# Patient Record
Sex: Male | Born: 1975 | Hispanic: No | Marital: Married | Smoking: Never smoker
Health system: Southern US, Community
[De-identification: ages and names within clinical notes are randomized; demographics above are authoritative.]

---

## 2000-08-21 ENCOUNTER — Emergency Department (HOSPITAL_COMMUNITY): Admission: EM | Admit: 2000-08-21 | Discharge: 2000-08-22 | Payer: Self-pay | Admitting: Emergency Medicine

## 2006-05-09 ENCOUNTER — Emergency Department: Payer: Self-pay | Admitting: Emergency Medicine

## 2006-07-16 ENCOUNTER — Emergency Department (HOSPITAL_COMMUNITY): Admission: EM | Admit: 2006-07-16 | Discharge: 2006-07-16 | Payer: Self-pay | Admitting: Emergency Medicine

## 2010-11-23 LAB — I-STAT 8, (EC8 V) (CONVERTED LAB)
BUN: 13
Chloride: 105
HCT: 43
Hemoglobin: 14.6
Operator id: 215651
Potassium: 3.5
Sodium: 138
pCO2, Ven: 51.3 — ABNORMAL HIGH

## 2014-01-17 ENCOUNTER — Encounter (HOSPITAL_COMMUNITY): Payer: Self-pay | Admitting: Emergency Medicine

## 2014-01-17 ENCOUNTER — Emergency Department (HOSPITAL_COMMUNITY)
Admission: EM | Admit: 2014-01-17 | Discharge: 2014-01-17 | Disposition: A | Discharge status: Home / Self Care | Payer: BC Managed Care – PPO | Attending: Emergency Medicine | Admitting: Emergency Medicine

## 2014-01-17 DIAGNOSIS — Z79899 Other long term (current) drug therapy: Secondary | ICD-10-CM | POA: Insufficient documentation

## 2014-01-17 DIAGNOSIS — Z791 Long term (current) use of non-steroidal anti-inflammatories (NSAID): Secondary | ICD-10-CM | POA: Insufficient documentation

## 2014-01-17 DIAGNOSIS — M545 Low back pain, unspecified: Secondary | ICD-10-CM

## 2014-01-17 DIAGNOSIS — M549 Dorsalgia, unspecified: Secondary | ICD-10-CM | POA: Diagnosis present

## 2014-01-17 MED ORDER — HYDROCODONE-ACETAMINOPHEN 5-325 MG PO TABS: 1.0000 | ORAL_TABLET | ORAL | Status: DC | PRN

## 2014-01-17 MED ORDER — NAPROXEN 500 MG PO TABS: 500.0000 mg | ORAL_TABLET | Freq: Two times a day (BID) | ORAL | Status: DC

## 2014-01-17 MED ORDER — CYCLOBENZAPRINE HCL 10 MG PO TABS: 10.0000 mg | ORAL_TABLET | Freq: Two times a day (BID) | ORAL | Status: DC | PRN

## 2014-01-17 NOTE — ED Provider Notes (Signed)
CSN: [id number]     Arrival date & time 01/17/14  0950 History  This chart was scribed for non-physician practitioner, Kerrie BuffaloHope Jozelyn Kuwahara, FNP,working with Vida RollerBrian D Miller, MD, by Karle PlumberJennifer Tensley, ED Scribe. This patient was seen in room APFT23/APFT23 and the patient's care was started at 11:06 AM.  Chief Complaint  Patient presents with  . Back Pain   Patient is a 38 y.o. male presenting with back pain. The history is provided by the patient. No language interpreter was used.  Back Pain Associated symptoms: no dysuria, no fever, no headaches, no numbness and no weakness     HPI Comments:  Johnny Kelly is a 38 y.o. male who presents to the Emergency Department complaining of severe, non-radiating lower back pain that began two days ago while working lifting heavy object. 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 He states he  Walking or any other kinds of movement makes the pain worse. Denies alleviating factors. Pt has taken Tylenol and Ibuprofen with only mild relief of the pain. Denies nausea, vomiting, bowel or bladder incontinence, otalgia, sore throat, rhinorrhea, neck stiffness, fever, chills, dysuria, hematuria, frequency or difficulty urinating, visual changes, dizziness, numbness, tingling or weakness of the lower extremities, or HA. Denies trauma, injury or fall.  Does not have a PCP.  History reviewed. No pertinent past medical history. History reviewed. No pertinent past surgical history. Family History  Problem Relation Age of Onset  . Diabetes Father   . Hypertension Father    History  Substance Use Topics  . Smoking status: Never Smoker   . Smokeless tobacco: Never Used  . Alcohol Use: Yes     Comment: occasional    Review of Systems  Constitutional: Negative for fever and chills.  HENT: Negative for ear pain, rhinorrhea and sore throat.   Eyes: Negative for visual disturbance.  Gastrointestinal: Negative for nausea and vomiting.  Genitourinary:  Negative for dysuria, frequency, hematuria and decreased urine volume.       No bowel or bladder incontinence.  Musculoskeletal: Positive for back pain. Negative for neck pain and neck stiffness.  Skin: Negative for color change and wound.  Neurological: Negative for dizziness, weakness, numbness and headaches.  All other systems reviewed and are negative.   Allergies  Review of patient's allergies indicates no known allergies.  Home Medications   Prior to Admission medications   Medication Sig Start Date End Date Taking? Authorizing Provider  cyclobenzaprine (FLEXERIL) 10 MG tablet Take 1 tablet (10 mg total) by mouth 2 (two) times daily as needed for muscle spasms. 01/17/14   Joesph Marcy Orlene OchM Sophronia Varney, NP  HYDROcodone-acetaminophen (NORCO/VICODIN) 5-325 MG per tablet Take 1 tablet by mouth every 4 (four) hours as needed. 01/17/14   Lonna Rabold Orlene OchM Jaideep Pollack, NP  naproxen (NAPROSYN) 500 MG tablet Take 1 tablet (500 mg total) by mouth 2 (two) times daily. 01/17/14   Deyjah Kindel Orlene OchM Syria Kestner, NP   Triage Vitals: BP 131/67 mmHg  Pulse 70  Temp(Src) 98.8 F (37.1 C) (Oral)  Resp 16  Ht 5\' 9"  (1.753 m)  Wt 218 lb (98.884 kg)  BMI 32.18 kg/m2  SpO2 97% Physical Exam  Constitutional: He is oriented to person, place, and time. He appears well-developed and well-nourished. No distress.  HENT:  Head: Normocephalic and atraumatic.  Eyes: EOM are normal. Pupils are equal, round, and reactive to light.  Neck: Normal range of motion. Neck supple.  Cardiovascular: Normal rate, regular rhythm and normal heart sounds.  Exam reveals no  gallop and no friction rub.   No murmur heard. Adequate circulation. Radial pulses intact. Dorsalis pedis pulses equal bilaterally. No lower extremity edema.  Pulmonary/Chest: Effort normal and breath sounds normal. No respiratory distress. He has no wheezes. He has no rales.  Abdominal: Soft. Bowel sounds are normal. There is no tenderness.  Musculoskeletal: Normal range of motion. He exhibits  tenderness. He exhibits no edema.       Lumbar back: He exhibits tenderness. He exhibits normal range of motion, no deformity, no spasm and normal pulse.  Tenderness bilaterally to lumbar area. Spasm noted. Pain with any ROM. SLR without difficulty.  Neurological: He is alert and oriented to person, place, and time. He has normal strength. No cranial nerve deficit or sensory deficit. Coordination and gait normal.  Reflex Scores:      Bicep reflexes are 2+ on the right side and 2+ on the left side.      Brachioradialis reflexes are 2+ on the right side and 2+ on the left side.      Patellar reflexes are 2+ on the right side and 2+ on the left side.      Achilles reflexes are 2+ on the right side and 2+ on the left side. Ambulatory with steady gait. No foot drag. Good grip strength equal bilaterally.  Skin: Skin is warm and dry.  Psychiatric: He has a normal mood and affect. His behavior is normal.  Nursing note and vitals reviewed.   ED Course  Procedures (including critical care time) DIAGNOSTIC STUDIES: Oxygen Saturation is 97% on RA, normal by my interpretation.   COORDINATION OF CARE: 11:11 AM- Will prescribe Naproxen, Vicodin, Flexeril and give resources to follow up with PCP. Will provide work note. Pt verbalizes understanding and agrees to plan.  MDM  38 y.o. male with low back pain x 3 days. Stable for discharge without neuro deficits. Discussed with the patient clinical findings and plan of care and all questioned fully answered. He will return if any problems arise.    Medication List    TAKE these medications        cyclobenzaprine 10 MG tablet  Commonly known as:  FLEXERIL  Take 1 tablet (10 mg total) by mouth 2 (two) times daily as needed for muscle spasms.     HYDROcodone-acetaminophen 5-325 MG per tablet  Commonly known as:  NORCO/VICODIN  Take 1 tablet by mouth every 4 (four) hours as needed.     naproxen 500 MG tablet  Commonly known as:  NAPROSYN  Take 1  tablet (500 mg total) by mouth 2 (two) times daily.        Final diagnoses:  Lumbosacral pain   I personally performed the services described in this documentation, which was scribed in my presence. The recorded information has been reviewed and is accurate.    [address]Wilda Wetherell TahokaM Guneet Delpino, NP 01/18/14 1730  Vida RollerBrian D Miller, MD 01/19/14 [phone]

## 2014-01-17 NOTE — ED Notes (Signed)
Patient c/o lower back pain x3 days, progressively getting worse. 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 Denies any issue with BMs or urinary incontinence.

## 2014-08-18 ENCOUNTER — Encounter (HOSPITAL_COMMUNITY): Payer: Self-pay | Admitting: Emergency Medicine

## 2014-08-18 ENCOUNTER — Emergency Department (HOSPITAL_COMMUNITY)
Admission: EM | Admit: 2014-08-18 | Discharge: 2014-08-18 | Disposition: A | Discharge status: Home / Self Care | Payer: BLUE CROSS/BLUE SHIELD | Attending: Emergency Medicine | Admitting: Emergency Medicine

## 2014-08-18 DIAGNOSIS — K029 Dental caries, unspecified: Secondary | ICD-10-CM | POA: Insufficient documentation

## 2014-08-18 DIAGNOSIS — E663 Overweight: Secondary | ICD-10-CM | POA: Diagnosis not present

## 2014-08-18 DIAGNOSIS — K088 Other specified disorders of teeth and supporting structures: Secondary | ICD-10-CM | POA: Diagnosis not present

## 2014-08-18 DIAGNOSIS — R51 Headache: Secondary | ICD-10-CM | POA: Diagnosis not present

## 2014-08-18 DIAGNOSIS — K0889 Other specified disorders of teeth and supporting structures: Secondary | ICD-10-CM

## 2014-08-18 MED ORDER — IBUPROFEN 600 MG PO TABS: 600.0000 mg | ORAL_TABLET | Freq: Four times a day (QID) | ORAL | Status: DC | PRN

## 2014-08-18 MED ORDER — PENICILLIN V POTASSIUM 500 MG PO TABS: 500.0000 mg | ORAL_TABLET | Freq: Four times a day (QID) | ORAL | Status: AC

## 2014-08-18 MED ORDER — OXYCODONE-ACETAMINOPHEN 5-325 MG PO TABS
1.0000 | ORAL_TABLET | Freq: Once | ORAL | Status: AC
  Administered 2014-08-18: 1 via ORAL
  Filled 2014-08-18: qty 1

## 2014-08-18 MED ORDER — KETOROLAC TROMETHAMINE 60 MG/2ML IM SOLN
60.0000 mg | Freq: Once | INTRAMUSCULAR | Status: AC
  Administered 2014-08-18: 60 mg via INTRAMUSCULAR
  Filled 2014-08-18: qty 2

## 2014-08-18 NOTE — ED Notes (Signed)
Pt reports dental pain x2 days. nad noted.

## 2014-08-18 NOTE — ED Provider Notes (Signed)
CSN: [id number]     Arrival date & time 08/18/14  4098 History   First MD Initiated Contact with Patient 08/18/14 [phone]     Chief Complaint  Patient presents with  . Dental Pain     (Consider location/radiation/quality/duration/timing/severity/associated sxs/prior Treatment) HPI  This is a 39 year old male who presents with right-sided dental pain for 2 days. Patient reports onset of symptoms 2 days ago. He reports pain around his lower premolar on the right that radiates into his jaw and ear. It is causing him to have a headache. He has taken Tylenol and BC powder at home with some limited relief.  Currently he rates his pain a 10 out of 10. He denies any difficulty swallowing or fevers.  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  History reviewed. No pertinent past medical history. History reviewed. No pertinent past surgical history. History reviewed. No pertinent family history. History  Substance Use Topics  . Smoking status: Never Smoker   . Smokeless tobacco: Not on file  . Alcohol Use: No    Review of Systems  Constitutional: Negative for fever.  HENT: Positive for dental problem. Negative for trouble swallowing.   Musculoskeletal: Negative for neck pain.  Neurological: Positive for headaches.  All other systems reviewed and are negative.     Allergies  Review of patient's allergies indicates not on file.  Home Medications   Prior to Admission medications   Medication Sig Start Date End Date Taking? Authorizing Provider  ibuprofen (ADVIL,MOTRIN) 600 MG tablet Take 1 tablet (600 mg total) by mouth every 6 (six) hours as needed. 08/18/14   Shon Baton, MD  penicillin v potassium (VEETID) 500 MG tablet Take 1 tablet (500 mg total) by mouth 4 (four) times daily. 08/18/14 08/25/14  Shon Baton, MD   BP 148/102 mmHg  Pulse 55  Temp(Src) 97.6 F (36.4 C) (Oral)  Resp 18  Ht  (1.702 m)  Wt 214  lb (97.07 kg)  BMI 33.51 kg/m2  SpO2 98% Physical Exam  Constitutional: He is oriented to person, place, and time. He appears well-developed and well-nourished.  Overweight  HENT:  Head: Normocephalic and atraumatic.  Poor dentition, multiple teeth pulled, tenderness over the gumline and periodontal tissue of the right premolar adjacent to prior pulled teeth, several dental caries noted, patient able to elevate tongue, no fullness in the sublingual region, no facial swelling or cellulitis noted, no abscess noted  Cardiovascular: Normal rate and regular rhythm.   Pulmonary/Chest: Effort normal. No respiratory distress.  Musculoskeletal: He exhibits no edema.  Neurological: He is alert and oriented to person, place, and time.  Skin: Skin is warm and dry.  Psychiatric: He has a normal mood and affect.  Nursing note and vitals reviewed.   ED Course  Procedures (including critical care time) Labs Review Labs Reviewed - No data to display  Imaging Review No results found.   EKG Interpretation None      MDM   Final diagnoses:  Pain, dental    Patient presents with uncomplicated dental pain. No signs of obvious abscess or dental infection. Patient given antibiotics and instructed to take 600 mg ibuprofen every 6 hours. He was given a dose of Toradol here. Patient follow-up with his dentist as scheduled.  After history, exam, and medical workup I feel the patient has been appropriately medically screened and is safe for discharge home. Pertinent diagnoses  were discussed with the patient. Patient was given return precautions.     Shon Batonourtney F Logan [place], MD 08/18/14 [phone]

## 2014-08-18 NOTE — Discharge Instructions (Signed)
You were seen today for dental pain. There is no obvious abscess. No other signs or symptoms of infection. He'll be given antibiotics and need to follow-up closely with your dentist. If he developed facial swelling, difficulty swallowing or any new or worsening symptoms you need to be reevaluated.  Dental Pain A tooth ache may be caused by cavities (tooth decay). Cavities expose the nerve of the tooth to air and hot or cold temperatures. It may come from an infection or abscess (also called a boil or furuncle) around your tooth. It is also often caused by dental caries (tooth decay). This causes the pain you are having. DIAGNOSIS  Your caregiver can diagnose this problem by exam. TREATMENT   If caused by an infection, it may be treated with medications which kill germs (antibiotics) and pain medications as prescribed by your caregiver. Take medications as directed.  Only take over-the-counter or prescription medicines for pain, discomfort, or fever as directed by your caregiver.  Whether the tooth ache today is caused by infection or dental disease, you should see your dentist as soon as possible for further care. SEEK MEDICAL CARE IF: The exam and treatment you received today has been provided on an emergency basis only. This is not a substitute for complete medical or dental care. If your problem worsens or new problems (symptoms) appear, and you are unable to meet with your dentist, call or return to this location. SEEK IMMEDIATE MEDICAL CARE IF:   You have a fever.  You develop redness and swelling of your face, jaw, or neck.  You are unable to open your mouth.  You have severe pain uncontrolled by pain medicine. MAKE SURE YOU:   Understand these instructions.  Will watch your condition.  Will get help right away if you are not doing well or get worse. Document Released: 01/22/2005 Document Revised: 04/16/2011 Document Reviewed: 09/10/2007 [facility] Patient Information 2015  [employer] [place] [place]. This information is not intended to replace advice given to you by your health care provider. Make sure you discuss any questions you have with your health care provider.

## 2015-05-23 ENCOUNTER — Encounter (HOSPITAL_COMMUNITY): Payer: Self-pay | Admitting: Emergency Medicine

## 2015-05-23 ENCOUNTER — Emergency Department (HOSPITAL_COMMUNITY)
Admission: EM | Admit: 2015-05-23 | Discharge: 2015-05-23 | Disposition: A | Discharge status: Home / Self Care | Payer: BLUE CROSS/BLUE SHIELD | Attending: Emergency Medicine | Admitting: Emergency Medicine

## 2015-05-23 DIAGNOSIS — H6091 Unspecified otitis externa, right ear: Secondary | ICD-10-CM | POA: Diagnosis not present

## 2015-05-23 DIAGNOSIS — Z77098 Contact with and (suspected) exposure to other hazardous, chiefly nonmedicinal, chemicals: Secondary | ICD-10-CM | POA: Insufficient documentation

## 2015-05-23 DIAGNOSIS — H9391 Unspecified disorder of right ear: Secondary | ICD-10-CM | POA: Diagnosis present

## 2015-05-23 MED ORDER — NEOMYCIN-POLYMYXIN-HC 1 % OT SOLN
3.0000 [drp] | Freq: Once | OTIC | Status: AC
  Administered 2015-05-23: 3 [drp] via OTIC
  Filled 2015-05-23: qty 10

## 2015-05-23 NOTE — ED Provider Notes (Signed)
CSN: [id number]     Arrival date & time 05/23/15  1757 History  By signing my name below, I, Johnny Kelly, attest that this documentation has been prepared under the direction and in the presence of Burgess AmorJulie Madolyn Ackroyd, PA-C Electronically Signed: Soijett Kelly, ED Scribe. 05/23/2015. 7:24 PM.    Chief Complaint  Patient presents with  . Ear Fullness    right      The history is provided by the patient. No language interpreter was used.    HPI Comments: Johnny Kelly is a 40 y.o. male who presents to the Emergency Department complaining of right ear fullness onset 5 days. Pt reports that he got gas in his right ear while completing work on his pickup truck. Pt states that he initially had ear pain which has since resolved. Pt notes that he has a buzzing sound to his right ear. Pt reports that he cleaned his right ear with Q-tips and there was pus on the Q-tip. Pt has associated symptoms of muffled hearing to right ear. He states that he has tried warm water flush, heating pad, and peroxide, with no relief for his symptoms. He denies ear pain/discharge, fever, and any other symptoms. Denies having a PCP. Denies allergies to medications. 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    History reviewed. No pertinent past medical history. History reviewed. No pertinent past surgical history. Family History  Problem Relation Age of Onset  . Diabetes Father   . Hypertension Father    Social History  Substance Use Topics  . Smoking status: Never Smoker   . Smokeless tobacco: None  . Alcohol Use: No     Comment: occasional    Review of Systems  Constitutional: Negative for fever.  HENT: Negative for ear discharge and ear pain.        Right ear fullness with muffled hearing.      Allergies  Review of patient's allergies indicates no known allergies.  Home Medications   Prior to Admission medications   Medication Sig Start Date End Date Taking? Authorizing Provider   cyclobenzaprine (FLEXERIL) 10 MG tablet Take 1 tablet (10 mg total) by mouth 2 (two) times daily as needed for muscle spasms. 01/17/14   Hope Orlene OchM Neese, NP  HYDROcodone-acetaminophen (NORCO/VICODIN) 5-325 MG per tablet Take 1 tablet by mouth every 4 (four) hours as needed. 01/17/14   Hope Orlene OchM Neese, NP  ibuprofen (ADVIL,MOTRIN) 600 MG tablet Take 1 tablet (600 mg total) by mouth every 6 (six) hours as needed. 08/18/14   Shon Batonourtney F Horton, MD  naproxen (NAPROSYN) 500 MG tablet Take 1 tablet (500 mg total) by mouth 2 (two) times daily. 01/17/14   Hope Orlene OchM Neese, NP   BP 118/75 mmHg  Pulse 64  Temp(Src) 98.5 F (36.9 C) (Oral)  Resp 16  Ht 5\' 7"  (1.702 m)  Wt 95.255 kg  BMI 32.88 kg/m2  SpO2 98% Physical Exam  Constitutional: He is oriented to person, place, and time. He appears well-developed and well-nourished. No distress.  HENT:  Head: Normocephalic and atraumatic.  Right Ear: Tympanic membrane is erythematous. Tympanic membrane is not bulging.  Right TM is erythematous but appears intact without bulging. Small collection of white fluid at the base of the TM at the external canal. Canal otherwise looks healthy without injury. Hearing acuity is present but reduced in comparison to the left.   Eyes: EOM are normal.  Neck: Neck supple.  Cardiovascular: Normal rate.   Pulmonary/Chest: Effort  normal. No respiratory distress.  Musculoskeletal: Normal range of motion.  Lymphadenopathy:       Head (right side): No submental, no submandibular, no tonsillar, no preauricular and no posterior auricular adenopathy present.       Head (left side): No submental, no submandibular, no tonsillar, no preauricular and no posterior auricular adenopathy present.    He has no cervical adenopathy.  Neurological: He is alert and oriented to person, place, and time.  Skin: Skin is warm and dry.  Psychiatric: He has a normal mood and affect. His behavior is normal.  Nursing note and vitals reviewed.   ED Course   Procedures (including critical care time) DIAGNOSTIC STUDIES: Oxygen Saturation is 98% on RA, nl by my interpretation.    COORDINATION OF CARE: 7:24 PM Discussed treatment plan with pt at bedside and pt agreed to plan.    Labs Review Labs Reviewed - No data to display  Imaging Review No results found.   EKG Interpretation None      MDM   Final diagnoses:  External otitis of right ear  Exposure to chemical irritant    Advised to stop using peroxide which may be contributing to irritation. Pt was placed on cortisporin gtts. Advised he needs a recheck by ENT this week. Referred to Dr. Suszanne Conners for a recheck in his [place] office this week.  Pt understands and agrees with plan. No caustic injury noted to ear canal, TM appears inflamed but intact.  I personally performed the services described in this documentation, which was scribed in my presence. The recorded information has been reviewed and is accurate.   Burgess Amor, PA-C 05/24/15 1321  Glynn Octave, MD 05/24/15 [phone]

## 2015-05-23 NOTE — Discharge Instructions (Signed)
Apply 3 drops of the cortisporin to your right ear three times daily.  Stop applying peroxide to the ear as this can be irritating.

## 2015-05-23 NOTE — ED Notes (Signed)
Pt got gas in right ear on last Thursday.  [facility] and told to wash ear out with warm water and use to use a heating pad.  Pt says he hears fluid in ear.  Denies any pain.

## 2018-02-24 ENCOUNTER — Other Ambulatory Visit: Payer: Self-pay

## 2018-02-24 ENCOUNTER — Emergency Department (HOSPITAL_COMMUNITY)
Admission: EM | Admit: 2018-02-24 | Discharge: 2018-02-24 | Disposition: A | Discharge status: Home / Self Care | Payer: Self-pay | Attending: Emergency Medicine | Admitting: Emergency Medicine

## 2018-02-24 ENCOUNTER — Emergency Department (HOSPITAL_COMMUNITY): Payer: Self-pay

## 2018-02-24 ENCOUNTER — Encounter (HOSPITAL_COMMUNITY): Payer: Self-pay

## 2018-02-24 DIAGNOSIS — Z79899 Other long term (current) drug therapy: Secondary | ICD-10-CM | POA: Insufficient documentation

## 2018-02-24 DIAGNOSIS — J40 Bronchitis, not specified as acute or chronic: Secondary | ICD-10-CM | POA: Insufficient documentation

## 2018-02-24 MED ORDER — PREDNISONE 50 MG PO TABS
60.0000 mg | ORAL_TABLET | Freq: Once | ORAL | Status: AC
  Administered 2018-02-24: 60 mg via ORAL
  Filled 2018-02-24: qty 1

## 2018-02-24 MED ORDER — PREDNISONE 10 MG PO TABS: 40.0000 mg | ORAL_TABLET | Freq: Every day | ORAL | 0 refills | Status: DC

## 2018-02-24 MED ORDER — IPRATROPIUM-ALBUTEROL 0.5-2.5 (3) MG/3ML IN SOLN
3.0000 mL | Freq: Once | RESPIRATORY_TRACT | Status: AC
  Administered 2018-02-24: 3 mL via RESPIRATORY_TRACT
  Filled 2018-02-24: qty 3

## 2018-02-24 MED ORDER — ALBUTEROL SULFATE HFA 108 (90 BASE) MCG/ACT IN AERS
2.0000 | INHALATION_SPRAY | Freq: Four times a day (QID) | RESPIRATORY_TRACT | Status: DC
  Administered 2018-02-24: 2 via RESPIRATORY_TRACT
  Filled 2018-02-24: qty 6.7

## 2018-02-24 NOTE — Discharge Instructions (Signed)
Take the prednisone as directed for the next 5 days.  Use your albuterol inhaler 2 puffs every 6 hours for the next 7 days.  Then as needed.  Return for any new or worse symptoms.  Chest x-ray today without evidence of pneumonia.

## 2018-02-24 NOTE — ED Triage Notes (Signed)
Pt reports cough x 1 week. Cough is non productive. Chest feels tight. Pt reports is has used mucinex

## 2018-02-24 NOTE — ED Notes (Signed)
Pt still has bilateral expiratory and inspiratory wheezes, but they are significantly better.

## 2018-02-24 NOTE — ED Provider Notes (Signed)
[facility] EMERGENCY DEPARTMENT Provider Note   CSN: [id number] Arrival date & time: 02/24/18  0706     History   Chief Complaint Chief Complaint  Patient presents with  . Cough    HPI Johnny Kelly is a 43 y.o. male.  Patient with productive cough for 1 week.  Actually may be a little bit longer according to his wife.  Associated with some chest tightness.  Some diaphoresis.  No true fevers no nausea vomiting or diarrhea.     History reviewed. No pertinent past medical history.  There are no active problems to display for this patient.   History reviewed. No pertinent surgical history.      Home Medications    Prior to Admission medications   Medication Sig Start Date End Date Taking? Authorizing Provider  cyclobenzaprine (FLEXERIL) 10 MG tablet Take 1 tablet (10 mg total) by mouth 2 (two) times daily as needed for muscle spasms. 01/17/14   Janne Napoleon, NP  HYDROcodone-acetaminophen (NORCO/VICODIN) 5-325 MG per tablet Take 1 tablet by mouth every 4 (four) hours as needed. 01/17/14   Janne Napoleon, NP  ibuprofen (ADVIL,MOTRIN) 600 MG tablet Take 1 tablet (600 mg total) by mouth every 6 (six) hours as needed. 08/18/14   Horton, Mayer Masker, MD  naproxen (NAPROSYN) 500 MG tablet Take 1 tablet (500 mg total) by mouth 2 (two) times daily. 01/17/14   Janne Napoleon, NP  predniSONE (DELTASONE) 10 MG tablet Take 4 tablets (40 mg total) by mouth daily. 02/24/18   Vanetta Mulders, MD    Family History Family History  Problem Relation Age of Onset  . Diabetes Father   . Hypertension Father     Social History Social History   Tobacco Use  . Smoking status: Never Smoker  Substance Use Topics  . Alcohol use: No    Comment: occasional  . Drug use: No     Allergies   Patient has no known allergies.   Review of Systems Review of Systems  Constitutional: Positive for diaphoresis. Negative for chills and fever.  HENT: Positive for congestion. Negative for  rhinorrhea and sore throat.   Eyes: Negative for visual disturbance.  Respiratory: Positive for cough. Negative for shortness of breath.   Cardiovascular: Negative for chest pain and leg swelling.  Gastrointestinal: Negative for abdominal pain, diarrhea, nausea and vomiting.  Genitourinary: Negative for dysuria.  Musculoskeletal: Negative for back pain and neck pain.  Skin: Negative for rash.  Neurological: Negative for dizziness, light-headedness and headaches.  Hematological: Does not bruise/bleed easily.  Psychiatric/Behavioral: Negative for confusion.     Physical Exam Updated Vital Signs BP (!) 134/91 (BP Location: Right Arm)   Pulse 70   Temp 98.5 F (36.9 C) (Oral)   Resp 20   Ht 1.702 m (5\' 7" )   Wt 104.3 kg   SpO2 96%   BMI 36.02 kg/m   Physical Exam Vitals signs and nursing note reviewed.  Constitutional:      Appearance: He is well-developed.  HENT:     Head: Normocephalic and atraumatic.  Eyes:     Conjunctiva/sclera: Conjunctivae normal.  Neck:     Musculoskeletal: Neck supple.  Cardiovascular:     Rate and Rhythm: Normal rate and regular rhythm.     Heart sounds: No murmur.  Pulmonary:     Effort: Pulmonary effort is normal. No respiratory distress.     Breath sounds: Wheezing present.  Abdominal:     Palpations: Abdomen is soft.  Tenderness: There is no abdominal tenderness.  Musculoskeletal: Normal range of motion.  Skin:    General: Skin is warm and dry.  Neurological:     General: No focal deficit present.     Mental Status: He is alert and oriented to person, place, and time.      ED Treatments / Results  Labs (all labs ordered are listed, but only abnormal results are displayed) Labs Reviewed - No data to display  EKG None  Radiology Dg Chest 2 View  Result Date: 02/24/2018 CLINICAL DATA:  Cough for approximately 1 week EXAM: CHEST - 2 VIEW COMPARISON:  None. FINDINGS: Lungs are clear. Heart size and pulmonary vascularity are  normal. No adenopathy. No evident bone lesions. IMPRESSION: No edema or consolidation. Electronically Signed   By: Bretta Bang III M.D.   On: 02/24/2018 07:48    Procedures Procedures (including critical care time)  Medications Ordered in ED Medications  predniSONE (DELTASONE) tablet 60 mg (has no administration in time range)  albuterol (PROVENTIL HFA;VENTOLIN HFA) 108 (90 Base) MCG/ACT inhaler 2 puff (has no administration in time range)  ipratropium-albuterol (DUONEB) 0.5-2.5 (3) MG/3ML nebulizer solution 3 mL (3 mLs Nebulization Given 02/24/18 0813)     Initial Impression / Assessment and Plan / ED Course  I have reviewed the triage vital signs and the nursing notes.  Pertinent labs & imaging results that were available during my care of the patient were reviewed by me and considered in my medical decision making (see chart for details).     Patient nontoxic no acute distress.  Chest x-ray negative for pneumonia.  Patient did have some bilateral wheezing.  Did improve with albuterol Atrovent nebulizer.  But still had some slight wheezing.  Patient felt much better however.  We will go ahead and treat with albuterol inhaler and prednisone.  Symptoms consistent with a bronchitis probably secondary to an upper respiratory infection.  Even possibly the flu.  Final Clinical Impressions(s) / ED Diagnoses   Final diagnoses:  Bronchitis    ED Discharge Orders         Ordered    predniSONE (DELTASONE) 10 MG tablet  Daily     02/24/18 0831           Vanetta Mulders, MD 02/24/18 [phone]

## 2018-02-24 NOTE — ED Notes (Signed)
Have paged respiratory  

## 2018-03-24 ENCOUNTER — Encounter (HOSPITAL_COMMUNITY): Payer: Self-pay | Admitting: Emergency Medicine

## 2018-03-24 ENCOUNTER — Emergency Department (HOSPITAL_COMMUNITY): Payer: Self-pay

## 2018-03-24 ENCOUNTER — Other Ambulatory Visit: Payer: Self-pay

## 2018-03-24 ENCOUNTER — Emergency Department (HOSPITAL_COMMUNITY)
Admission: EM | Admit: 2018-03-24 | Discharge: 2018-03-25 | Disposition: A | Discharge status: Home / Self Care | Payer: Self-pay | Attending: Emergency Medicine | Admitting: Emergency Medicine

## 2018-03-24 DIAGNOSIS — J209 Acute bronchitis, unspecified: Secondary | ICD-10-CM | POA: Insufficient documentation

## 2018-03-24 DIAGNOSIS — Z79899 Other long term (current) drug therapy: Secondary | ICD-10-CM | POA: Insufficient documentation

## 2018-03-24 DIAGNOSIS — J4541 Moderate persistent asthma with (acute) exacerbation: Secondary | ICD-10-CM | POA: Insufficient documentation

## 2018-03-24 MED ORDER — ALBUTEROL SULFATE (2.5 MG/3ML) 0.083% IN NEBU
5.0000 mg | INHALATION_SOLUTION | Freq: Once | RESPIRATORY_TRACT | Status: AC
  Administered 2018-03-25: 5 mg via RESPIRATORY_TRACT
  Filled 2018-03-24: qty 6

## 2018-03-24 NOTE — ED Triage Notes (Signed)
Pt c/o SOB intermittently x 6 weeks, pt c/o cough and congestion and reports being seen for same, pt was given prednisone and an inhaler which has not helped

## 2018-03-25 LAB — BASIC METABOLIC PANEL
ANION GAP: 7 (ref 5–15)
BUN: 17 mg/dL (ref 6–20)
CALCIUM: 9.2 mg/dL (ref 8.9–10.3)
CO2: 24 mmol/L (ref 22–32)
Chloride: 107 mmol/L (ref 98–111)
Creatinine, Ser: 0.8 mg/dL (ref 0.61–1.24)
GFR calc Af Amer: >60 mL/min (ref >60)
GFR calc non Af Amer: >60 mL/min (ref >60)
GLUCOSE: 111 mg/dL — AB (ref 70–99)
POTASSIUM: 3.5 mmol/L (ref 3.5–5.1)
Sodium: 138 mmol/L (ref 135–145)

## 2018-03-25 LAB — CBC WITH DIFFERENTIAL/PLATELET
Abs Immature Granulocytes: 0.05 10*3/uL (ref 0.00–0.07)
Basophils Absolute: 0.1 10*3/uL (ref 0.0–0.1)
Basophils Relative: 1 %
EOS ABS: 1 10*3/uL — AB (ref 0.0–0.5)
Eosinophils Relative: 11 %
HCT: 48.8 % (ref 39.0–52.0)
Hemoglobin: 17.1 g/dL — ABNORMAL HIGH (ref 13.0–17.0)
IMMATURE GRANULOCYTES: 1 %
Lymphocytes Relative: 43 %
Lymphs Abs: 3.9 10*3/uL (ref 0.7–4.0)
MCH: 30.9 pg (ref 26.0–34.0)
MCHC: 35 g/dL (ref 30.0–36.0)
MCV: 88.1 fL (ref 80.0–100.0)
MONOS PCT: 6 %
Monocytes Absolute: 0.5 10*3/uL (ref 0.1–1.0)
NEUTROS PCT: 38 %
Neutro Abs: 3.4 10*3/uL (ref 1.7–7.7)
Platelets: 241 10*3/uL (ref 150–400)
RBC: 5.54 MIL/uL (ref 4.22–5.81)
RDW: 12.7 % (ref 11.5–15.5)
WBC: 9 10*3/uL (ref 4.0–10.5)
nRBC: 0 % (ref 0.0–0.2)

## 2018-03-25 LAB — TROPONIN I: Troponin I: <0.03 ng/mL (ref <0.03)

## 2018-03-25 MED ORDER — ALBUTEROL SULFATE HFA 108 (90 BASE) MCG/ACT IN AERS
2.0000 | INHALATION_SPRAY | RESPIRATORY_TRACT | Status: DC | PRN
  Administered 2018-03-25: 2 via RESPIRATORY_TRACT
  Filled 2018-03-25: qty 6.7

## 2018-03-25 MED ORDER — PREDNISONE 20 MG PO TABS: 60.0000 mg | ORAL_TABLET | Freq: Every day | ORAL | 0 refills | Status: AC

## 2018-03-25 MED ORDER — ALBUTEROL SULFATE (2.5 MG/3ML) 0.083% IN NEBU: 5.0000 mg | INHALATION_SOLUTION | Freq: Once | RESPIRATORY_TRACT | Status: DC

## 2018-03-25 MED ORDER — METHYLPREDNISOLONE SODIUM SUCC 125 MG IJ SOLR
125.0000 mg | Freq: Once | INTRAMUSCULAR | Status: AC
  Administered 2018-03-25: 125 mg via INTRAVENOUS
  Filled 2018-03-25: qty 2

## 2018-03-25 MED ORDER — IPRATROPIUM-ALBUTEROL 0.5-2.5 (3) MG/3ML IN SOLN
3.0000 mL | Freq: Once | RESPIRATORY_TRACT | Status: AC
  Administered 2018-03-25: 3 mL via RESPIRATORY_TRACT
  Filled 2018-03-25: qty 3

## 2018-03-25 NOTE — ED Provider Notes (Signed)
[facility] EMERGENCY DEPARTMENT Provider Note   CSN: [id number] Arrival date & time: 03/24/18  2228    History   Chief Complaint Chief Complaint  Patient presents with  . Shortness of Breath    HPI Johnny Kelly is a 43 y.o. male.     The history is provided by the patient. No language interpreter was used.  Shortness of Breath  Severity:  Moderate Onset quality:  Gradual Duration:  1 month Timing:  Constant Progression:  Worsening Chronicity:  Recurrent Relieved by:  Nothing Worsened by:  Nothing Ineffective treatments:  Inhaler Associated symptoms: chest pain   Risk factors comment:  Second hand smoke   History reviewed. No pertinent past medical history.  There are no active problems to display for this patient.   History reviewed. No pertinent surgical history.      Home Medications    Prior to Admission medications   Medication Sig Start Date End Date Taking? Authorizing Provider  cyclobenzaprine (FLEXERIL) 10 MG tablet Take 1 tablet (10 mg total) by mouth 2 (two) times daily as needed for muscle spasms. 01/17/14   Janne Napoleon, NP  HYDROcodone-acetaminophen (NORCO/VICODIN) 5-325 MG per tablet Take 1 tablet by mouth every 4 (four) hours as needed. 01/17/14   Janne Napoleon, NP  ibuprofen (ADVIL,MOTRIN) 600 MG tablet Take 1 tablet (600 mg total) by mouth every 6 (six) hours as needed. 08/18/14   Horton, Mayer Masker, MD  naproxen (NAPROSYN) 500 MG tablet Take 1 tablet (500 mg total) by mouth 2 (two) times daily. 01/17/14   Janne Napoleon, NP  predniSONE (DELTASONE) 10 MG tablet Take 4 tablets (40 mg total) by mouth daily. 02/24/18   Vanetta Mulders, MD    Family History Family History  Problem Relation Age of Onset  . Diabetes Father   . Hypertension Father     Social History Social History   Tobacco Use  . Smoking status: Never Smoker  . Smokeless tobacco: Never Used  Substance Use Topics  . Alcohol use: No    Comment: occasional  . Drug  use: No     Allergies   Patient has no known allergies.   Review of Systems Review of Systems  Respiratory: Positive for shortness of breath.   Cardiovascular: Positive for chest pain.  All other systems reviewed and are negative.    Physical Exam Updated Vital Signs BP 116/85 (BP Location: Right Arm)   Pulse 72   Temp 98.2 F (36.8 C) (Oral)   Resp 20   Ht 5\' 7"  (1.702 m)   Wt 96.6 kg   SpO2 98%   BMI 33.36 kg/m   Physical Exam Vitals signs and nursing note reviewed.  Constitutional:      Appearance: He is well-developed.  HENT:     Head: Normocephalic.     Mouth/Throat:     Mouth: Mucous membranes are moist.  Neck:     Musculoskeletal: Normal range of motion.  Cardiovascular:     Rate and Rhythm: Normal rate.  Pulmonary:     Effort: Pulmonary effort is normal.     Breath sounds: Examination of the right-upper field reveals wheezing. Examination of the left-upper field reveals wheezing. Examination of the right-middle field reveals wheezing. Examination of the left-middle field reveals wheezing. Examination of the right-lower field reveals wheezing. Examination of the left-lower field reveals wheezing. Wheezing present.  Chest:     Chest wall: No mass or deformity.  Abdominal:     General: There  is no distension.  Musculoskeletal: Normal range of motion.  Skin:    General: Skin is warm.  Neurological:     General: No focal deficit present.     Mental Status: He is alert and oriented to person, place, and time.  Psychiatric:        Mood and Affect: Mood normal.      ED Treatments / Results  Labs (all labs ordered are listed, but only abnormal results are displayed) Labs Reviewed  CBC WITH DIFFERENTIAL/PLATELET  BASIC METABOLIC PANEL    EKG EKG Interpretation  Date/Time:  Monday March 24 2018 23:04:38 EST Ventricular Rate:  71 PR Interval:  178 QRS Duration: 90 QT Interval:  382 QTC Calculation: 415 R Axis:   57 Text Interpretation:   Normal sinus rhythm Normal ECG No old tracing to compare Confirmed by Dione Booze ([id number]) on 03/24/2018 11:11:42 PM   Radiology Dg Chest 2 View  Result Date: 03/24/2018 CLINICAL DATA:  Chest congestion EXAM: CHEST - 2 VIEW COMPARISON:  02/24/2018 FINDINGS: The heart size and mediastinal contours are within normal limits. Both lungs are clear. The visualized skeletal structures are unremarkable. IMPRESSION: No active cardiopulmonary disease. Electronically Signed   By: Jasmine Pang M.D.   On: 03/24/2018 23:53    Procedures Procedures (including critical care time)  Medications Ordered in ED Medications  methylPREDNISolone sodium succinate (SOLU-MEDROL) 125 mg/2 mL injection 125 mg (has no administration in time range)  ipratropium-albuterol (DUONEB) 0.5-2.5 (3) MG/3ML nebulizer solution 3 mL (has no administration in time range)  albuterol (PROVENTIL) (2.5 MG/3ML) 0.083% nebulizer solution 5 mg (5 mg Nebulization Given 03/25/18 0016)     Initial Impression / Assessment and Plan / ED Course  I have reviewed the triage vital signs and the nursing notes.  Pertinent labs & imaging results that were available during my care of the patient were reviewed by me and considered in my medical decision making (see chart for details).        Pt had albuterol neb x 1.  Reexam  Continued wheezing.  Chart reviewed from previous visit.    Final Clinical Impressions(s) / ED Diagnoses   Final diagnoses:  Moderate persistent asthma with exacerbation    ED Discharge Orders    None       Osie Cheeks 03/25/18 0059    Dione Booze, MD 03/25/18 [phone]

## 2020-07-26 IMAGING — DX DG CHEST 2V
2 series · 2 of 2 positions shown · non-contrast
Comparison: None.

CLINICAL DATA: Cough for approximately 1 week

EXAM:
CHEST - 2 VIEW

[chest pa]
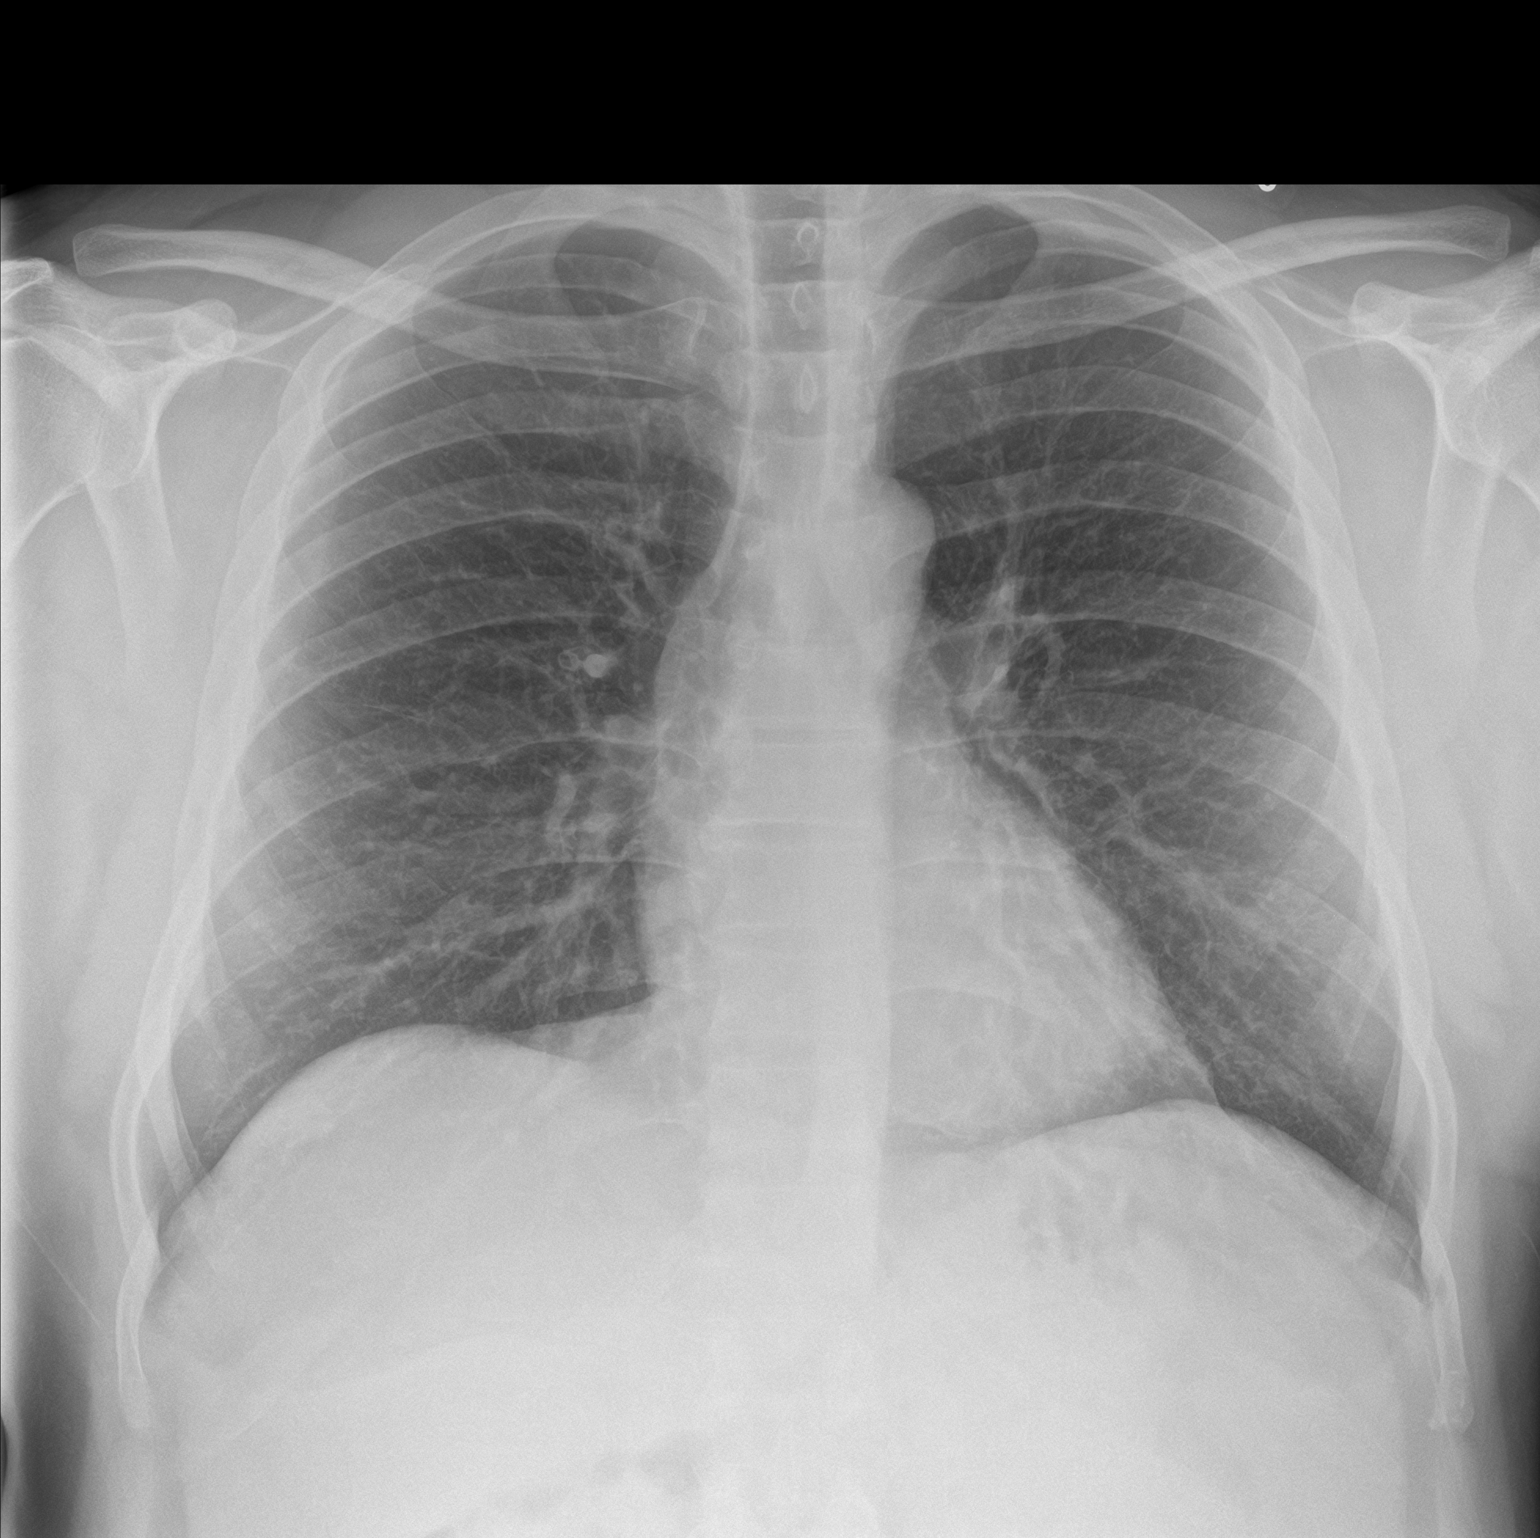

[chest lat]
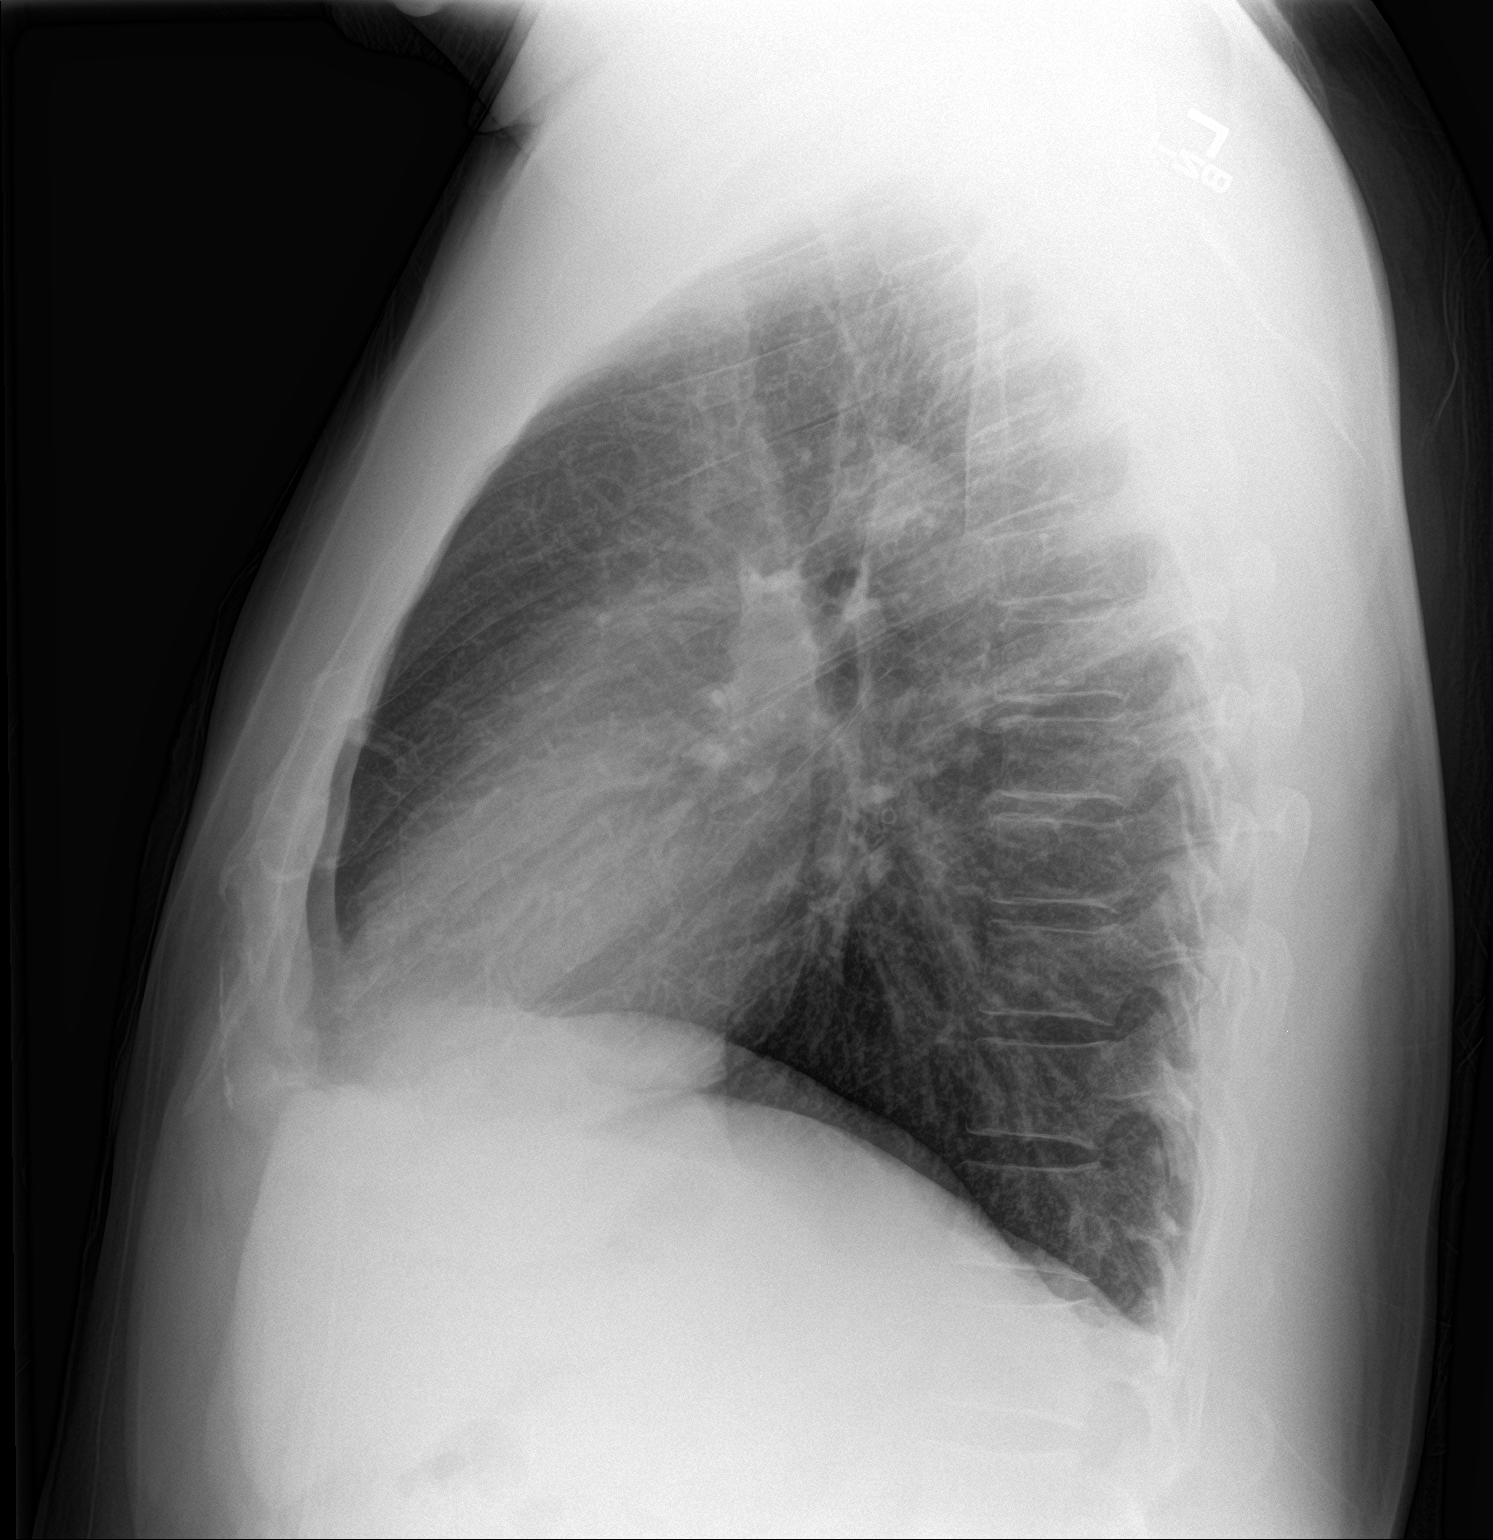

[2 of 2 positions shown; findings below may reference images not displayed]

FINDINGS: Lungs are clear. Heart size and pulmonary vascularity are normal. No
adenopathy. No evident bone lesions.
IMPRESSION: No edema or consolidation.

## 2022-03-07 DIAGNOSIS — Z1231 Encounter for screening mammogram for malignant neoplasm of breast: Secondary | ICD-10-CM | POA: Diagnosis not present

## 2022-03-13 DIAGNOSIS — R928 Other abnormal and inconclusive findings on diagnostic imaging of breast: Secondary | ICD-10-CM | POA: Diagnosis not present

## 2022-04-11 DIAGNOSIS — M62830 Muscle spasm of back: Secondary | ICD-10-CM | POA: Diagnosis not present

## 2022-04-11 DIAGNOSIS — M546 Pain in thoracic spine: Secondary | ICD-10-CM | POA: Diagnosis not present

## 2022-04-11 DIAGNOSIS — Z13220 Encounter for screening for lipoid disorders: Secondary | ICD-10-CM | POA: Diagnosis not present

## 2022-04-11 DIAGNOSIS — M545 Low back pain, unspecified: Secondary | ICD-10-CM | POA: Diagnosis not present

## 2022-04-11 DIAGNOSIS — I10 Essential (primary) hypertension: Secondary | ICD-10-CM | POA: Diagnosis not present

## 2022-04-11 DIAGNOSIS — Z6835 Body mass index (BMI) 35.0-35.9, adult: Secondary | ICD-10-CM | POA: Diagnosis not present

## 2022-04-11 DIAGNOSIS — Z131 Encounter for screening for diabetes mellitus: Secondary | ICD-10-CM | POA: Diagnosis not present

## 2022-04-11 DIAGNOSIS — E669 Obesity, unspecified: Secondary | ICD-10-CM | POA: Diagnosis not present

## 2022-08-17 DIAGNOSIS — Z833 Family history of diabetes mellitus: Secondary | ICD-10-CM | POA: Diagnosis not present

## 2022-08-17 DIAGNOSIS — E669 Obesity, unspecified: Secondary | ICD-10-CM | POA: Diagnosis not present

## 2022-08-17 DIAGNOSIS — G8929 Other chronic pain: Secondary | ICD-10-CM | POA: Diagnosis not present

## 2022-08-17 DIAGNOSIS — Z0189 Encounter for other specified special examinations: Secondary | ICD-10-CM | POA: Diagnosis not present

## 2022-08-17 DIAGNOSIS — Z6837 Body mass index (BMI) 37.0-37.9, adult: Secondary | ICD-10-CM | POA: Diagnosis not present

## 2022-08-17 DIAGNOSIS — Z808 Family history of malignant neoplasm of other organs or systems: Secondary | ICD-10-CM | POA: Diagnosis not present

## 2022-08-17 DIAGNOSIS — M542 Cervicalgia: Secondary | ICD-10-CM | POA: Diagnosis not present

## 2022-08-17 DIAGNOSIS — M545 Low back pain, unspecified: Secondary | ICD-10-CM | POA: Diagnosis not present

## 2022-08-31 DIAGNOSIS — Z6837 Body mass index (BMI) 37.0-37.9, adult: Secondary | ICD-10-CM | POA: Diagnosis not present

## 2022-09-04 DIAGNOSIS — Z6837 Body mass index (BMI) 37.0-37.9, adult: Secondary | ICD-10-CM | POA: Diagnosis not present

## 2022-09-04 DIAGNOSIS — R7303 Prediabetes: Secondary | ICD-10-CM | POA: Diagnosis not present

## 2022-09-04 DIAGNOSIS — M5441 Lumbago with sciatica, right side: Secondary | ICD-10-CM | POA: Diagnosis not present

## 2022-09-04 DIAGNOSIS — M542 Cervicalgia: Secondary | ICD-10-CM | POA: Diagnosis not present

## 2022-09-04 DIAGNOSIS — Z6838 Body mass index (BMI) 38.0-38.9, adult: Secondary | ICD-10-CM | POA: Diagnosis not present

## 2022-09-04 DIAGNOSIS — G8929 Other chronic pain: Secondary | ICD-10-CM | POA: Diagnosis not present

## 2022-09-04 DIAGNOSIS — M545 Low back pain, unspecified: Secondary | ICD-10-CM | POA: Diagnosis not present

## 2022-09-04 DIAGNOSIS — E669 Obesity, unspecified: Secondary | ICD-10-CM | POA: Diagnosis not present

## 2022-09-12 ENCOUNTER — Other Ambulatory Visit (HOSPITAL_COMMUNITY): Payer: Self-pay | Admitting: Family Medicine

## 2022-09-12 DIAGNOSIS — M542 Cervicalgia: Secondary | ICD-10-CM

## 2022-09-15 ENCOUNTER — Ambulatory Visit (HOSPITAL_COMMUNITY): Payer: Self-pay

## 2022-09-22 ENCOUNTER — Encounter (HOSPITAL_COMMUNITY): Payer: Self-pay

## 2022-09-22 ENCOUNTER — Ambulatory Visit (HOSPITAL_COMMUNITY): Payer: 59

## 2022-12-10 ENCOUNTER — Encounter: Payer: Self-pay | Admitting: Internal Medicine

## 2022-12-10 ENCOUNTER — Ambulatory Visit: Payer: 59 | Attending: Internal Medicine | Admitting: Internal Medicine

## 2022-12-10 VITALS — BP 138/94 | HR 85 | Ht 67.0 in | Wt 232.6 lb

## 2022-12-10 DIAGNOSIS — R079 Chest pain, unspecified: Secondary | ICD-10-CM | POA: Insufficient documentation

## 2022-12-10 DIAGNOSIS — R002 Palpitations: Secondary | ICD-10-CM | POA: Diagnosis not present

## 2022-12-10 NOTE — Progress Notes (Signed)
Cardiology Office Note  Date: 12/10/2022   ID: GIOVANNE NICKOLSON, DOB 1975-11-21, MRN [id number]  PCP:  Leone Payor, FNP  Cardiologist:  Marjo Bicker, MD Electrophysiologist:  None   History of Present Illness: Johnny Kelly is a 47 y.o. male with no past medical history was referred to cardiology clinic for evaluation of palpitations.   Ongoing palpitations for many years but recently worse in the last 1 year.  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  Palpitations associated with chest pain and SOB.  Last for few seconds.  No true angina.  No true DOE, orthopnea, PND, syncope or dizziness.  No leg swelling.    Current Outpatient Medications  Medication Sig Dispense Refill   traMADol (ULTRAM-ER) 100 MG 24 hr tablet Take 100 mg by mouth daily as needed for pain.     predniSONE (DELTASONE) 20 MG tablet Take 3 tablets (60 mg total) by mouth daily. (Patient not taking: Reported on 12/10/2022) 15 tablet 0   No current facility-administered medications for this visit.   Allergies:  Patient has no known allergies.   Social History: The patient  reports that he has never smoked. He has never used smokeless tobacco. He reports that he does not drink alcohol and does not use drugs.   Family History: The patient's family history includes Diabetes in his father; Hypertension in his father.   ROS:  Please see the history of present illness. Otherwise, complete review of systems is positive for none.  All other systems are reviewed and negative.   Physical Exam: VS:  BP (!) 138/94 (BP Location: Left Arm)   Pulse 85   Ht 5\' 7"  (1.702 m)   Wt 232 lb 9.6 oz (105.5 kg)   SpO2 98%   BMI 36.43 kg/m , BMI Body mass index is 36.43 kg/m.  Wt Readings from Last 3 Encounters:  12/10/22 232 lb 9.6 oz (105.5 kg)  03/24/18 213 lb (96.6 kg)  02/24/18 230 lb (104.3 kg)    General: Patient appears comfortable at rest. HEENT:  Conjunctiva and lids normal, oropharynx clear with moist mucosa. Neck: Supple, no elevated JVP or carotid bruits, no thyromegaly. Lungs: Clear to auscultation, nonlabored breathing at rest. Cardiac: Regular rate and rhythm, no S3 or significant systolic murmur, no pericardial rub. Abdomen: Soft, nontender, no hepatomegaly, bowel sounds present, no guarding or rebound. Extremities: No pitting edema, distal pulses 2+. Skin: Warm and dry. Musculoskeletal: No kyphosis. Neuropsychiatric: Alert and oriented x3, affect grossly appropriate.  Recent Labwork: No results found for requested labs within last 365 days.  No results found for: "CHOL", "TRIG", "HDL", "CHOLHDL", "VLDL", "LDLCALC", "LDLDIRECT"   Assessment and Plan:   Palpitations Chest pain    -Ongoing palpitations for many years but recently worsened in the last 1 year, occurring few times per month and lasting for a few seconds.  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  Palpitations associated with SOB and chest pain as well.  Obtain 30-day event monitor and exercise tolerance test.   I spent a total duration 45 minutes reviewing the prior notes, EKG, labs, face-to-face discussion/counseling of his medical condition, pathophysiology, evaluation, management, ordering test and documenting the findings in the note.   Medication Adjustments/Labs and Tests Ordered: Current medicines are reviewed at length with the patient today.  Concerns regarding medicines are outlined above.    Disposition:  Follow up pending results  Signed, Tyreak Reagle Priya Hala Narula,  MD, 12/10/2022 4:18 PM    [facility] at [facility] [address], [place], [place] [address]

## 2022-12-10 NOTE — Patient Instructions (Signed)
Medication Instructions:  Your physician recommends that you continue on your current medications as directed. Please refer to the Current Medication list given to you today.  *If you need a refill on your cardiac medications before your next appointment, please call your pharmacy*   Lab Work: None If you have labs (blood work) drawn today and your tests are completely normal, you will receive your results only by: MyChart Message (if you have MyChart) OR A paper copy in the mail If you have any lab test that is abnormal or we need to change your treatment, we will call you to review the results.   Testing/Procedures: Your physician has requested that you have an exercise tolerance test. For further information please visit [email or web address] Please also follow instruction sheet, as given.    Follow-Up: At [facility], you and your health needs are our priority.  As part of our continuing mission to provide you with exceptional heart care, we have created designated Provider Care Teams.  These Care Teams include your primary Cardiologist (physician) and Advanced Practice Providers (APPs -  Physician Assistants and Nurse Practitioners) who all work together to provide you with the care you need, when you need it.  We recommend signing up for the patient portal called "MyChart".  Sign up information is provided on this After Visit Summary.  MyChart is used to connect with patients for Virtual Visits (Telemedicine).  Patients are able to view lab/test results, encounter notes, upcoming appointments, etc.  Non-urgent messages can be sent to your provider as well.   To learn more about what you can do with MyChart, go to [email or web address].    Your next appointment:    Follow up is pending.   Provider:   You may see Vishnu P Mallipeddi, MD or one of the following Advanced Practice Providers on your designated Care Team:   Randall An, PA-C  Jacolyn Reedy, PA-C      Other Instructions Cardiac Event Monitor will be mailed to your home.

## 2022-12-19 DIAGNOSIS — R002 Palpitations: Secondary | ICD-10-CM | POA: Diagnosis not present

## 2022-12-21 ENCOUNTER — Encounter (HOSPITAL_COMMUNITY): Payer: 59

## 2022-12-26 ENCOUNTER — Ambulatory Visit: Payer: 59 | Attending: Internal Medicine

## 2022-12-26 DIAGNOSIS — R002 Palpitations: Secondary | ICD-10-CM

## 2023-01-15 ENCOUNTER — Telehealth: Payer: Self-pay

## 2023-01-15 NOTE — Telephone Encounter (Signed)
Received fax on 01/15/23 that they received early termination notice for 30 day event monitor

## 2023-02-15 ENCOUNTER — Ambulatory Visit (HOSPITAL_COMMUNITY): Admission: RE | Admit: 2023-02-15 | Payer: 59 | Source: Ambulatory Visit

## 2023-09-13 DIAGNOSIS — R7303 Prediabetes: Secondary | ICD-10-CM | POA: Diagnosis not present

## 2023-09-13 DIAGNOSIS — Z0000 Encounter for general adult medical examination without abnormal findings: Secondary | ICD-10-CM | POA: Diagnosis not present

## 2023-11-08 ENCOUNTER — Encounter: Payer: Self-pay | Admitting: *Deleted

## 2023-11-08 NOTE — Progress Notes (Signed)
 Johnny Kelly                                          MRN: [id number]   11/08/2023   The [facility] Quality Team Specialist reviewed this patient medical record for the purposes of chart review for care gap closure. The following were reviewed: chart review for care gap closure-kidney health evaluation for diabetes:eGFR  and uACR.    [facility] Quality Team

## 2024-01-06 DIAGNOSIS — E1165 Type 2 diabetes mellitus with hyperglycemia: Secondary | ICD-10-CM | POA: Diagnosis not present

## 2024-02-03 ENCOUNTER — Other Ambulatory Visit (HOSPITAL_COMMUNITY): Payer: Self-pay

## 2024-02-03 DIAGNOSIS — M545 Low back pain, unspecified: Secondary | ICD-10-CM
